# Patient Record
Sex: Female | Born: 2000 | Race: White | Marital: Single
Health system: Southern US, Community
[De-identification: ages and names within clinical notes are randomized; demographics above are authoritative.]

## PROBLEM LIST (undated history)

## (undated) DIAGNOSIS — D649 Anemia, unspecified: Secondary | ICD-10-CM

## (undated) HISTORY — PX: WISDOM TOOTH EXTRACTION: SHX21

## (undated) HISTORY — PX: EYE SURGERY: SHX253

## (undated) HISTORY — DX: Anemia, unspecified: D64.9

## (undated) HISTORY — PX: TONSILLECTOMY: SUR1361

---
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
## 2022-12-13 ENCOUNTER — Encounter: Payer: PRIVATE HEALTH INSURANCE | Admitting: Radiology

## 2022-12-20 ENCOUNTER — Encounter: Payer: Self-pay | Admitting: Radiology

## 2022-12-20 ENCOUNTER — Ambulatory Visit (INDEPENDENT_AMBULATORY_CARE_PROVIDER_SITE_OTHER): Payer: No Typology Code available for payment source | Admitting: Radiology

## 2022-12-20 VITALS — BP 112/68 | Ht 66.0 in | Wt 158.0 lb

## 2022-12-20 DIAGNOSIS — Z01419 Encounter for gynecological examination (general) (routine) without abnormal findings: Secondary | ICD-10-CM | POA: Diagnosis not present

## 2022-12-20 DIAGNOSIS — Z3041 Encounter for surveillance of contraceptive pills: Secondary | ICD-10-CM

## 2022-12-20 LAB — CBC
HCT: 39.9 % (ref 35.0–45.0)
Hemoglobin: 13.6 g/dL (ref 11.7–15.5)
MCH: 28.8 pg (ref 27.0–33.0)
MPV: 9 fL (ref 7.5–12.5)
Platelets: 390 10*3/uL (ref 140–400)
RBC: 4.72 10*6/uL (ref 3.80–5.10)
RDW: 12.4 % (ref 11.0–15.0)
WBC: 6.7 10*3/uL (ref 3.8–10.8)

## 2022-12-20 MED ORDER — DROSPIRENONE-ETHINYL ESTRADIOL 3-0.03 MG PO TABS: 1.0000 | ORAL_TABLET | Freq: Every day | ORAL | 4 refills | Status: DC

## 2022-12-20 NOTE — Progress Notes (Signed)
   Gabriella Frye [birth year] [id number]   History:  21 y.o. G0 presents for annual exam.No gyn concerns. Happy on current OCP. Same partner x 3 years.  Gynecologic History Patient's last menstrual period was 11/28/2022 (approximate). Period Pattern: (!) Irregular (skips periods with ocps) Menstrual Flow: Light Menstrual Control: Tampon Dysmenorrhea: (!) Mild Dysmenorrhea Symptoms: Cramping Contraception/Family planning: OCP (estrogen/progesterone) Sexually active: yes   Obstetric History OB History  Gravida Para Term Preterm AB Living  0 0 0 0 0 0  SAB IAB Ectopic Multiple Live Births  0 0 0 0 0     The following portions of the patient's history were reviewed and updated as appropriate: allergies, current medications, past family history, past medical history, past social history, past surgical history, and problem list.  Review of Systems Pertinent items noted in HPI and remainder of comprehensive ROS otherwise negative.   Past medical history, past surgical history, family history and social history were all reviewed and documented in the EPIC chart.   Exam:  Vitals:   12/20/22 0912  BP: 112/68  Weight: 158 lb (71.7 kg)  Height: _0  (1.676 m)   Body mass index is 25.5 kg/m.  General appearance:  Normal Thyroid:  Symmetrical, normal in size, without palpable masses or nodularity. Respiratory  Auscultation:  Clear without wheezing or rhonchi Cardiovascular  Auscultation:  Regular rate, without rubs, murmurs or gallops  Edema/varicosities:  Not grossly evident Abdominal  Soft,nontender, without masses, guarding or rebound.  Liver/spleen:  No organomegaly noted  Hernia:  None appreciated  Skin  Inspection:  Grossly normal Breasts: Examined lying and sitting.   Right: Without masses, retractions, nipple discharge or axillary adenopathy.   Left: Without masses, retractions, nipple discharge or axillary adenopathy. Genitourinary   deferred   Patient  informed chaperone available to be present for breast and pelvic exam. Patient has requested no chaperone to be present. Patient has been advised what will be completed during breast and pelvic exam.   Assessment/Plan:   1. Well woman exam with routine gynecological exam Requests screening labs for insurance - Thyroid Panel With TSH - CBC - Vitamin D (25 hydroxy) - Comp Met (CMET)  2. Oral contraceptive pill surveillance - drospirenone-ethinyl estradiol (YASMIN) 3-0.03 MG tablet; Take 1 tablet by mouth daily.  Dispense: 84 tablet; Refill: 4  Declines STI screen Pap at 22     Discussed SBE,pap and STI screening as directed/appropriate. Recommend 168mns of exercise weekly, including weight bearing exercise. Encouraged the use of seatbelts and sunscreen. Return in 1 year for annual or as needed.   CKerry DoryWHNP-BC 9:38 AM 12/20/2022

## 2022-12-21 LAB — CBC
MCHC: 34.1 g/dL (ref 32.0–36.0)
MCV: 84.5 fL (ref 80.0–100.0)

## 2022-12-21 LAB — VITAMIN D 25 HYDROXY (VIT D DEFICIENCY, FRACTURES): Vit D, 25-Hydroxy: 32 ng/mL (ref 30–100)

## 2022-12-21 LAB — THYROID PANEL WITH TSH
Free Thyroxine Index: 2.8 (ref 1.4–3.8)
T3 Uptake: 25 % (ref 22–35)
T4, Total: 11.3 ug/dL (ref 5.3–11.7)
TSH: 2.01 mIU/L

## 2023-12-30 ENCOUNTER — Ambulatory Visit (INDEPENDENT_AMBULATORY_CARE_PROVIDER_SITE_OTHER): Payer: No Typology Code available for payment source | Admitting: Radiology

## 2023-12-30 ENCOUNTER — Encounter: Payer: Self-pay | Admitting: Radiology

## 2023-12-30 ENCOUNTER — Other Ambulatory Visit (HOSPITAL_COMMUNITY)
Admission: RE | Admit: 2023-12-30 | Discharge: 2023-12-30 | Disposition: A | Discharge status: Home / Self Care | Payer: PRIVATE HEALTH INSURANCE | Source: Ambulatory Visit | Attending: Radiology | Admitting: Radiology

## 2023-12-30 VITALS — BP 110/68 | HR 72 | Ht 66.0 in | Wt 160.0 lb

## 2023-12-30 DIAGNOSIS — Z01419 Encounter for gynecological examination (general) (routine) without abnormal findings: Secondary | ICD-10-CM

## 2023-12-30 DIAGNOSIS — Z3041 Encounter for surveillance of contraceptive pills: Secondary | ICD-10-CM

## 2023-12-30 MED ORDER — JUNEL FE 24 1-20 MG-MCG(24) PO TABS: 1.0000 | ORAL_TABLET | Freq: Every day | ORAL | 4 refills | Status: DC

## 2023-12-30 NOTE — Progress Notes (Signed)
 Gabriella Frye [birth year] [id number]   History:  22 y.o. G0 presents for annual exam. C/o headaches worse with menses. No new partners. No gyn concerns. Needs labs for insurance yearly.  Gynecologic History Patient's last menstrual period was 10/31/2023 (approximate). Period Cycle (Days):  (period every 3 months with continuous ocps) Period Duration (Days): 5 Period Pattern: Regular Menstrual Flow: Moderate Menstrual Control: Tampon Dysmenorrhea: (!) Mild Dysmenorrhea Symptoms: Cramping Contraception/Family planning: OCP (estrogen/progesterone) Sexually active: yes   Obstetric History OB History  Gravida Para Term Preterm AB Living  0 0 0 0 0 0  SAB IAB Ectopic Multiple Live Births  0 0 0 0 0    The following portions of the patient's history were reviewed and updated as appropriate: allergies, current medications, past family history, past medical history, past social history, past surgical history, and problem list.  Review of Systems  All other systems reviewed and are negative.   Past medical history, past surgical history, family history and social history were all reviewed and documented in the EPIC chart.  Exam:  Vitals:   12/30/23 1322  BP: 110/68  Pulse: 72  SpO2: 98%  Weight: 160 lb (72.6 kg)  Height: 5' 6 (1.676 m)   Body mass index is 25.82 kg/m.  Physical Exam Vitals and nursing note reviewed. Exam conducted with a chaperone present.  Constitutional:      Appearance: Normal appearance. She is normal weight.  HENT:     Head: Normocephalic and atraumatic.  Neck:     Thyroid : No thyroid  mass, thyromegaly or thyroid  tenderness.  Cardiovascular:     Rate and Rhythm: Regular rhythm.     Heart sounds: Normal heart sounds.  Pulmonary:     Effort: Pulmonary effort is normal.     Breath sounds: Normal breath sounds.  Chest:  Breasts:    Breasts are symmetrical.     Right: Normal. No inverted nipple, mass, nipple discharge, skin change or  tenderness.     Left: Normal. No inverted nipple, mass, nipple discharge, skin change or tenderness.  Abdominal:     General: Abdomen is flat. Bowel sounds are normal.     Palpations: Abdomen is soft.  Genitourinary:    General: Normal vulva.     Vagina: Normal. No vaginal discharge, bleeding or lesions.     Cervix: Normal. No discharge or lesion.     Uterus: Normal. Not enlarged and not tender.      Adnexa: Right adnexa normal and left adnexa normal.       Right: No mass, tenderness or fullness.         Left: No mass, tenderness or fullness.    Lymphadenopathy:     Upper Body:     Right upper body: No axillary adenopathy.     Left upper body: No axillary adenopathy.  Skin:    General: Skin is warm and dry.  Neurological:     Mental Status: She is alert and oriented to person, place, and time.  Psychiatric:        Mood and Affect: Mood normal.        Thought Content: Thought content normal.        Judgment: Judgment normal.      Darice Hoit, CMA present for exam  Assessment/Plan:   1. Well woman exam with routine gynecological exam (Primary) - CBC - Comp Met (CMET) - TSH - Vitamin D  (25 hydroxy) - Cytology - PAP( [place])  2. Oral contraceptive pill surveillance Will  change OCPs to help with menstrual headaches - Norethindrone Acetate-Ethinyl Estrad-FE (JUNEL  FE 24) 1-20 MG-MCG(24) tablet; Take 1 tablet by mouth daily.  Dispense: 84 tablet; Refill: 4    Discussed SBE, colonoscopy and DEXA screening as directed/appropriate. Recommend of exercise weekly, including weight bearing exercise. Encouraged the use of seatbelts and sunscreen.  Return in about 1 year (around 12/29/2024) for Annual.  GINETTE COZIER B WHNP-BC 1:46 PM 12/30/2023

## 2023-12-30 NOTE — Patient Instructions (Signed)

## 2023-12-31 LAB — CBC
HCT: 29.8 % — ABNORMAL LOW (ref 35.0–45.0)
Hemoglobin: 9.7 g/dL — ABNORMAL LOW (ref 11.7–15.5)
MCH: 26.2 pg — ABNORMAL LOW (ref 27.0–33.0)
MCHC: 32.6 g/dL (ref 32.0–36.0)
MCV: 80.5 fL (ref 80.0–100.0)
MPV: 8.8 fL (ref 7.5–12.5)
Platelets: 522 10*3/uL — ABNORMAL HIGH (ref 140–400)
RBC: 3.7 10*6/uL — ABNORMAL LOW (ref 3.80–5.10)
RDW: 13.6 % (ref 11.0–15.0)
WBC: 7.3 10*3/uL (ref 3.8–10.8)

## 2023-12-31 LAB — COMPREHENSIVE METABOLIC PANEL
AG Ratio: 1.3 (calc) (ref 1.0–2.5)
ALT: 27 U/L (ref 6–29)
AST: 12 U/L (ref 10–30)
Albumin: 4 g/dL (ref 3.6–5.1)
Alkaline phosphatase (APISO): 77 U/L (ref 31–125)
BUN: 10 mg/dL (ref 7–25)
CO2: 25 mmol/L (ref 20–32)
Calcium: 9.2 mg/dL (ref 8.6–10.2)
Chloride: 102 mmol/L (ref 98–110)
Creat: 0.88 mg/dL (ref 0.50–0.96)
Globulin: 3 g/dL (ref 1.9–3.7)
Glucose, Bld: 81 mg/dL (ref 65–99)
Potassium: 3.8 mmol/L (ref 3.5–5.3)
Sodium: 138 mmol/L (ref 135–146)
Total Bilirubin: 0.3 mg/dL (ref 0.2–1.2)
Total Protein: 7 g/dL (ref 6.1–8.1)

## 2023-12-31 LAB — TSH: TSH: 1.53 m[IU]/L

## 2023-12-31 LAB — VITAMIN D 25 HYDROXY (VIT D DEFICIENCY, FRACTURES): Vit D, 25-Hydroxy: 31 ng/mL (ref 30–100)

## 2024-01-07 LAB — CYTOLOGY - PAP: Diagnosis: NEGATIVE

## 2024-03-17 ENCOUNTER — Other Ambulatory Visit: Payer: Self-pay

## 2024-03-17 DIAGNOSIS — D649 Anemia, unspecified: Secondary | ICD-10-CM

## 2024-03-29 ENCOUNTER — Other Ambulatory Visit

## 2024-03-29 DIAGNOSIS — D649 Anemia, unspecified: Secondary | ICD-10-CM

## 2024-03-30 ENCOUNTER — Telehealth: Payer: Self-pay

## 2024-03-30 ENCOUNTER — Other Ambulatory Visit: Payer: Self-pay

## 2024-03-30 DIAGNOSIS — D649 Anemia, unspecified: Secondary | ICD-10-CM

## 2024-03-30 LAB — IRON,TIBC AND FERRITIN PANEL
%SAT: 5 % — ABNORMAL LOW (ref 16–45)
Ferritin: 2 ng/mL — ABNORMAL LOW (ref 16–154)
Iron: 25 ng/mL — ABNORMAL LOW (ref 40–190)
TIBC: 540 ug/dL — ABNORMAL HIGH (ref 250–450)

## 2024-03-30 LAB — CBC
HCT: 37.7 % (ref 35.0–45.0)
Hemoglobin: 11.3 g/dL — ABNORMAL LOW (ref 11.7–15.5)
MCH: 22.2 pg — ABNORMAL LOW (ref 27.0–33.0)
MCHC: 30 g/dL — ABNORMAL LOW (ref 32.0–36.0)
MCV: 74.2 fL — ABNORMAL LOW (ref 80.0–100.0)
MPV: 9.1 fL (ref 7.5–12.5)
Platelets: 472 10*3/uL — ABNORMAL HIGH (ref 140–400)
RBC: 5.08 10*6/uL (ref 3.80–5.10)
RDW: 15.5 % — ABNORMAL HIGH (ref 11.0–15.0)
WBC: 8.8 10*3/uL (ref 3.8–10.8)

## 2024-03-30 MED ORDER — ACCRUFER 30 MG PO CAPS: 1.0000 | ORAL_CAPSULE | Freq: Two times a day (BID) | ORAL | 0 refills | Status: DC

## 2024-03-30 NOTE — Telephone Encounter (Signed)
 Prior authorization request received from covermymeds for Accrufer 30 mg.  PA initiated.  Patient notified.  KEY: [id number] DX: anemia, D64.9

## 2024-03-31 NOTE — Telephone Encounter (Signed)
 Prior authorization for Accrufer 30 mg was denied.  Request Reference Number: [id number]. ACCRUFER CAP 30MG  is denied due to Plan Exclusion.  Medication is not covered by the patient's insurance plan.  Do you want to initiate an appeal?  Records were sent with prior authorization.  Advise.

## 2024-03-31 NOTE — Telephone Encounter (Signed)
 Winfield Rast with [employer] can help with PA appeal [phone]

## 2024-03-31 NOTE — Telephone Encounter (Signed)
 Winfield Rast contacted with [facility] regarding PA denial for Accrufer 30 mg.  Per Zella Ball:  since PA was denied by [employer], she will reach out to the pharmacy to have them offer the patient a discount of $39 for this RX.  Patient also notified of PA denial and option for $39 price.

## 2024-05-04 ENCOUNTER — Other Ambulatory Visit: Payer: Self-pay | Admitting: Radiology

## 2024-05-04 DIAGNOSIS — D649 Anemia, unspecified: Secondary | ICD-10-CM

## 2024-05-04 MED ORDER — ACCRUFER 30 MG PO CAPS: 1.0000 | ORAL_CAPSULE | Freq: Two times a day (BID) | ORAL | 0 refills | Status: DC

## 2024-05-05 ENCOUNTER — Telehealth: Payer: Self-pay

## 2024-05-05 NOTE — Telephone Encounter (Signed)
 Received prior authorization request from covermymeds for Accrufer  30mg .  New RX was sent to pharmacy.  Previous PA was denied and patient was advised to use $39 discount coupon.  Will attempt another PA and see if insurance will approve. Patient notified.  KEY: [id number] DX: D64.9

## 2024-05-11 NOTE — Telephone Encounter (Signed)
 Attempted another PA for Accrufer  30 mg.  PA denied.  Patient has been using discount coupon.

## 2024-06-28 ENCOUNTER — Other Ambulatory Visit

## 2024-06-28 DIAGNOSIS — D649 Anemia, unspecified: Secondary | ICD-10-CM

## 2024-06-29 ENCOUNTER — Ambulatory Visit: Payer: Self-pay | Admitting: Radiology

## 2024-06-29 LAB — CBC
HCT: 44.6 % (ref 35.0–45.0)
Hemoglobin: 14.5 g/dL (ref 11.7–15.5)
MCH: 27.7 pg (ref 27.0–33.0)
MCHC: 32.5 g/dL (ref 32.0–36.0)
MCV: 85.1 fL (ref 80.0–100.0)
MPV: 8.3 fL (ref 7.5–12.5)
Platelets: 388 10*3/uL (ref 140–400)
RBC: 5.24 10*6/uL — ABNORMAL HIGH (ref 3.80–5.10)
RDW: 14.5 % (ref 11.0–15.0)
WBC: 8.4 10*3/uL (ref 3.8–10.8)

## 2024-06-29 LAB — IRON,TIBC AND FERRITIN PANEL
%SAT: 17 % (ref 16–45)
Ferritin: 20 ng/mL (ref 16–154)
Iron: 71 ug/dL (ref 40–190)
TIBC: 408 ug/dL (ref 250–450)

## 2024-07-29 DIAGNOSIS — D649 Anemia, unspecified: Secondary | ICD-10-CM

## 2024-07-30 MED ORDER — ACCRUFER 30 MG PO CAPS: 1.0000 | ORAL_CAPSULE | Freq: Two times a day (BID) | ORAL | 0 refills | Status: DC

## 2024-07-30 NOTE — Telephone Encounter (Signed)
 Med refill request:Accrufer  30 mg caps PO BID Last AEX: 12/30/23 -JC Next AEX: Not scheduled, MyChart message sent  Last MMG (if hormonal med) N/A  Labs 06/28/24, WNL, check at next AEX  Refill authorized: Please Advise?

## 2024-09-02 ENCOUNTER — Other Ambulatory Visit: Payer: Self-pay | Admitting: Radiology

## 2024-09-02 DIAGNOSIS — D649 Anemia, unspecified: Secondary | ICD-10-CM

## 2024-09-02 MED ORDER — ACCRUFER 30 MG PO CAPS: 1.0000 | ORAL_CAPSULE | Freq: Two times a day (BID) | ORAL | 1 refills | Status: DC

## 2024-12-08 ENCOUNTER — Telehealth: Payer: Self-pay | Admitting: *Deleted

## 2024-12-08 NOTE — Telephone Encounter (Signed)
 Spoke with patient. Patient is requesting to schedule an AEX, states she needs it scheduled by end of year for insurance purposes.   Advised last AEX 12/30/2023, next due on or after 12/30/24. Pap 12/30/23 normal, repeat 3 yrs. Explained purpose of AEX and pap. Patient states insurance requires exam before end of year. I advised if she gets AEX early, she would need to sign waiver that she would be responsible for OOP cost if not covered by insurance. Advised if physical needed would need to see PCP.   Patient is going to review with her father before scheduling and return call.

## 2024-12-09 ENCOUNTER — Other Ambulatory Visit: Payer: Self-pay | Admitting: Radiology

## 2024-12-09 DIAGNOSIS — Z3041 Encounter for surveillance of contraceptive pills: Secondary | ICD-10-CM

## 2024-12-09 NOTE — Telephone Encounter (Signed)
 Med refill request:   Norethindrone Acetate-Ethinyl Estrad-FE (JUNEL  FE 24) 1-20 MG-MCG(24) tablet  Start:  12/30/23 Disp: 84 tablets Refills:  4  Last AEX: 12/30/23 Next AEX:  Not yet scheduled Last MMG (if hormonal med):  N/A Refill authorized? Please Advise.

## 2024-12-14 NOTE — Telephone Encounter (Signed)
 No appt scheduled to date.  Patient aware of recommendations and to call if appt needed.   Routing FYI  Encounter closed.

## 2024-12-21 ENCOUNTER — Other Ambulatory Visit: Payer: Self-pay | Admitting: Radiology

## 2024-12-21 DIAGNOSIS — D649 Anemia, unspecified: Secondary | ICD-10-CM

## 2024-12-21 MED ORDER — ACCRUFER 30 MG PO CAPS: 1.0000 | ORAL_CAPSULE | Freq: Two times a day (BID) | ORAL | 1 refills | Status: DC

## 2025-01-05 ENCOUNTER — Encounter: Payer: Self-pay | Admitting: Radiology

## 2025-01-05 ENCOUNTER — Ambulatory Visit: Admitting: Radiology

## 2025-01-05 VITALS — BP 112/64 | HR 94 | Ht 66.0 in | Wt 172.0 lb

## 2025-01-05 DIAGNOSIS — D649 Anemia, unspecified: Secondary | ICD-10-CM | POA: Diagnosis not present

## 2025-01-05 DIAGNOSIS — Z01419 Encounter for gynecological examination (general) (routine) without abnormal findings: Secondary | ICD-10-CM

## 2025-01-05 DIAGNOSIS — Z1331 Encounter for screening for depression: Secondary | ICD-10-CM

## 2025-01-05 DIAGNOSIS — Z3041 Encounter for surveillance of contraceptive pills: Secondary | ICD-10-CM

## 2025-01-05 MED ORDER — HAILEY 24 FE 1-20 MG-MCG(24) PO TABS: 1.0000 | ORAL_TABLET | Freq: Every day | ORAL | 4 refills | Status: AC

## 2025-01-05 MED ORDER — ACCRUFER 30 MG PO CAPS: 1.0000 | ORAL_CAPSULE | Freq: Every day | ORAL | 1 refills | Status: AC

## 2025-01-05 NOTE — Progress Notes (Signed)
 "  Gabriella Frye Apr 10, 2001 [id number]   History:  24 y.o. G0 presents for annual exam. Doing well on OCPs and Accrufer . Ferritin recently checked at PCP, has increased to 102.   Gynecologic History Patient's last menstrual period was 12/09/2024 (approximate). Period Cycle (Days): 28 (skips periods with COC) Period Duration (Days): 4 Period Pattern: (!) Irregular Menstrual Flow: Light Menstrual Control: Tampon Dysmenorrhea: (!) Mild Dysmenorrhea Symptoms: Cramping Contraception/Family planning: OCP (estrogen/progesterone) Sexually active: yes Last Pap: 12/24. Results were: normal  Obstetric History OB History  Gravida Para Term Preterm AB Living  0 0 0 0 0 0  SAB IAB Ectopic Multiple Live Births  0 0 0 0 0       01/05/2025    8:58 AM 12/30/2023    1:25 PM  Depression screen PHQ 2/9  Decreased Interest 0 0  Down, Depressed, Hopeless 0 0  PHQ - 2 Score 0 0      Latest Reference Range & Units 03/29/24 08:45 06/28/24 09:23  Iron 40 - 190 mcg/dL 25 (L) 71  TIBC 749 - 549 mcg/dL (calc) 459 (H) 591  %SAT 16 - 45 % (calc) 5 (L) 17  Ferritin 16 - 154 ng/mL 2 (L) 20  (L): Data is abnormally low (H): Data is abnormally high  The following portions of the patient's history were reviewed and updated as appropriate: allergies, current medications, past family history, past medical history, past social history, past surgical history, and problem list.  Review of Systems  All other systems reviewed and are negative.   Past medical history, past surgical history, family history and social history were all reviewed and documented in the EPIC chart.  Exam:  Vitals:   01/05/25 0855  BP: 112/64  Pulse: 94  SpO2: 99%  Weight: 172 lb (78 kg)  Height: 5' 6 (1.676 m)   Body mass index is 27.76 kg/m.  Physical Exam Vitals and nursing note reviewed. Exam conducted with a chaperone present.  Constitutional:      Appearance: Normal appearance. She is normal weight.  HENT:      Head: Normocephalic and atraumatic.  Neck:     Thyroid : No thyroid  mass, thyromegaly or thyroid  tenderness.  Cardiovascular:     Rate and Rhythm: Regular rhythm.     Heart sounds: Normal heart sounds.  Pulmonary:     Effort: Pulmonary effort is normal.     Breath sounds: Normal breath sounds.  Chest:  Breasts:    Breasts are symmetrical.     Right: Normal. No inverted nipple, mass, nipple discharge, skin change or tenderness.     Left: Normal. No inverted nipple, mass, nipple discharge, skin change or tenderness.  Abdominal:     General: Abdomen is flat. Bowel sounds are normal.     Palpations: Abdomen is soft.  Genitourinary:    General: Normal vulva.     Vagina: Normal. No vaginal discharge, bleeding or lesions.     Cervix: Normal. No discharge or lesion.     Uterus: Normal. Not enlarged and not tender.      Adnexa: Right adnexa normal and left adnexa normal.       Right: No mass, tenderness or fullness.         Left: No mass, tenderness or fullness.    Lymphadenopathy:     Upper Body:     Right upper body: No axillary adenopathy.     Left upper body: No axillary adenopathy.  Skin:    General: Skin is warm  and dry.  Neurological:     Mental Status: She is alert and oriented to person, place, and time.  Psychiatric:        Mood and Affect: Mood normal.        Thought Content: Thought content normal.        Judgment: Judgment normal.      Gabriella Frye, CMA present for exam  Assessment/Plan:   1. Well woman exam with routine gynecological exam (Primary) Pap 2027  2. Oral contraceptive pill surveillance - Norethindrone Acetate-Ethinyl Estrad-FE (HAILEY  24 FE) 1-20 MG-MCG(24) tablet; Take 1 tablet by mouth daily.  Dispense: 84 tablet; Refill: 4  3. Anemia, unspecified type Decrease to taking once daily - Ferric Maltol  (ACCRUFER ) 30 MG CAPS; Take 1 capsule (30 mg total) by mouth daily.  Dispense: 30 capsule; Refill: 1  4. Depression screening negative     Return in  about 1 year (around 01/05/2026) for Annual.  GINETTE SHASTA NOVAK WHNP-BC 9:16 AM 01/05/2025 "

## 2025-01-05 NOTE — Patient Instructions (Signed)

## 2026-01-06 ENCOUNTER — Ambulatory Visit: Admitting: Radiology
